# Patient Record
Sex: Male | Born: 2000 | Race: Black or African American | Hispanic: No | Marital: Single | Smoking: Current every day smoker
Health system: Southern US, Community
[De-identification: ages and names within clinical notes are randomized; demographics above are authoritative.]

## PROBLEM LIST (undated history)

## (undated) DIAGNOSIS — J45909 Unspecified asthma, uncomplicated: Secondary | ICD-10-CM

---
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
## 2018-11-18 ENCOUNTER — Emergency Department
Admission: EM | Admit: 2018-11-18 | Discharge: 2018-11-18 | Disposition: A | Discharge status: Home / Self Care | Payer: HRSA Program | Attending: Student | Admitting: Student

## 2018-11-18 ENCOUNTER — Emergency Department: Payer: HRSA Program

## 2018-11-18 ENCOUNTER — Other Ambulatory Visit: Payer: Self-pay

## 2018-11-18 DIAGNOSIS — U071 COVID-19: Secondary | ICD-10-CM | POA: Diagnosis not present

## 2018-11-18 DIAGNOSIS — J45909 Unspecified asthma, uncomplicated: Secondary | ICD-10-CM | POA: Diagnosis not present

## 2018-11-18 DIAGNOSIS — J069 Acute upper respiratory infection, unspecified: Secondary | ICD-10-CM | POA: Insufficient documentation

## 2018-11-18 DIAGNOSIS — Z20822 Contact with and (suspected) exposure to covid-19: Secondary | ICD-10-CM

## 2018-11-18 DIAGNOSIS — Z20828 Contact with and (suspected) exposure to other viral communicable diseases: Secondary | ICD-10-CM

## 2018-11-18 DIAGNOSIS — R05 Cough: Secondary | ICD-10-CM | POA: Diagnosis present

## 2018-11-18 DIAGNOSIS — F17210 Nicotine dependence, cigarettes, uncomplicated: Secondary | ICD-10-CM | POA: Insufficient documentation

## 2018-11-18 MED ORDER — PSEUDOEPH-BROMPHEN-DM 30-2-10 MG/5ML PO SYRP: 5.0000 mL | ORAL_SOLUTION | Freq: Four times a day (QID) | ORAL | 0 refills | Status: DC | PRN

## 2018-11-18 NOTE — ED Triage Notes (Signed)
Pt here for cough, back pain, low grade fevers, SHOB, sore throat, and decreased appetite.  Mom was positive for COVID.  Pt sx started yesterday. Has been taking tylenol. Does have hx asthma.  Unlabored in triage.  VS all WNL.  Pt smokes weed and cigarettes despite asthma. Moving good air on auscultation.

## 2018-11-18 NOTE — ED Provider Notes (Signed)
[facility] Emergency Department Provider Note   ____________________________________________   First MD Initiated Contact with Patient 11/18/18 1755     (approximate)  I have reviewed the triage vital signs and the nursing notes.   HISTORY  Chief Complaint Cough    HPI Andrew Romero is a 18 y.o. male patient presents with productive cough and body aches.  Patient also stated pain with inspiration.  Patient states sore throat, decreased appetite and low-grade fever.  Patient has a history of asthma and does admit to smoking marijuana and tobacco use.  Patient state mother was diagnosed with COVID-19 1 week ago.         Past Medical History:  Diagnosis Date  . Asthma     There are no active problems to display for this patient.   History reviewed. No pertinent surgical history.  Prior to Admission medications   Medication Sig Start Date End Date Taking? Authorizing Provider  brompheniramine-pseudoephedrine-DM 30-2-10 MG/5ML syrup Take 5 mLs by mouth 4 (four) times daily as needed. 11/18/18   Sable Feil, PA-C    Allergies Patient has no known allergies.  History reviewed. No pertinent family history.  Social History Social History   Tobacco Use  . Smoking status: Current Every Day Smoker  . Smokeless tobacco: Never Used  Substance Use Topics  . Alcohol use: Yes    Comment: occasional  . Drug use: Yes    Types: Marijuana    Review of Systems Constitutional: No fever/chills Eyes: No visual changes. ENT: Sore throat.. Cardiovascular: Denies chest pain. Respiratory: Denies shortness of breath.  Productive cough. Gastrointestinal: No abdominal pain.  No nausea, no vomiting.  No diarrhea.  No constipation.  Decreased appetite. Genitourinary: Negative for dysuria. Musculoskeletal: Negative for back pain. Skin: Negative for rash. Neurological: Negative for headaches, focal weakness or numbness.    ____________________________________________   PHYSICAL EXAM:  VITAL SIGNS: ED Triage Vitals  Enc Vitals Group     BP 11/18/18 1725 117/81     Pulse Rate 11/18/18 1725 98     Resp 11/18/18 1725 20     Temp 11/18/18 1725 98.4 F (36.9 C)     Temp Source 11/18/18 1725 Oral     SpO2 11/18/18 1725 100 %     Weight 11/18/18 1726 160 lb (72.6 kg)     Height 11/18/18 1726 6\' 2"  (1.88 m)     Head Circumference --      Peak Flow --      Pain Score 11/18/18 1726 7     Pain Loc --      Pain Edu? --      Excl. in [place]? --     Constitutional: Alert and oriented. Well appearing and in no acute distress. Nose: Clear rhinorrhea. Mouth/Throat: Mucous membranes are moist.  Oropharynx non-erythematous. Neck: No stridor.   Hematological/Lymphatic/Immunilogical: No cervical lymphadenopathy. Cardiovascular: Normal rate, regular rhythm. Grossly normal heart sounds.  Good peripheral circulation. Respiratory: Normal respiratory effort.  No retractions. Lungs CTAB. Gastrointestinal: Soft and nontender. No distention. No abdominal bruits. No CVA tenderness. Skin:  Skin is warm, dry and intact. No rash noted. Psychiatric: Mood and affect are normal. Speech and behavior are normal.  ____________________________________________   LABS (all labs ordered are listed, but only abnormal results are displayed)  Labs Reviewed  SARS CORONAVIRUS 2 (TAT 6-12 HRS)   ____________________________________________  EKG   ____________________________________________  RADIOLOGY  ED MD interpretation:    Official radiology report(s): Dg Chest 1  View  Result Date: 11/18/2018 CLINICAL DATA:  Chest congestion, cough.  COVID exposure EXAM: CHEST  1 VIEW COMPARISON:  None. FINDINGS: Heart and mediastinal contours are within normal limits. No focal opacities or effusions. No acute bony abnormality. IMPRESSION: No active disease. Electronically Signed   By: Charlett NoseKevin  Dover M.D.   On: 11/18/2018 19:14     ____________________________________________   PROCEDURES  Procedure(s) performed (including Critical Care):  Procedures   ____________________________________________   INITIAL IMPRESSION / ASSESSMENT AND PLAN / ED COURSE  As part of my medical decision making, I reviewed the following data within the electronic MEDICAL RECORD NUMBER         Andrew Romero was evaluated in Emergency Department on 11/18/2018 for the symptoms described in the history of present illness. He was evaluated in the context of the global COVID-19 pandemic, which necessitated consideration that the patient might be at risk for infection with the SARS-CoV-2 virus that causes COVID-19. Institutional protocols and algorithms that pertain to the evaluation of patients at risk for COVID-19 are in a state of rapid change based on information released by regulatory bodies including the CDC and federal and state organizations. These policies and algorithms were followed during the patient's care in the ED.  Patient presents with signs and symptoms consistent respiratory infection.  Patient has positive exposure COVID-19 by close family member.  Discussed negative chest x-ray findings with patient.  Patient given discharge care instruction advised self quarantine pending results of COVID-19 testing.     ____________________________________________   FINAL CLINICAL IMPRESSION(S) / ED DIAGNOSES  Final diagnoses:  Close Exposure to Covid-19 Virus  Viral URI with cough     ED Discharge Orders         Ordered    brompheniramine-pseudoephedrine-DM 30-2-10 MG/5ML syrup  4 times daily PRN     11/18/18 1922           Note:  This document was prepared using Dragon voice recognition software and may include unintentional dictation errors.    Joni ReiningSmith, Ronald K, PA-C 11/18/18 Margretta [id number]    Miguel AschoffMonks, Sarah L., MD 11/19/18 [phone]

## 2018-11-18 NOTE — ED Notes (Signed)
See triage note   Here for cough and back pain  States he has had some low grade fever at home but afebrile on arrival   Has had some SOB

## 2018-11-18 NOTE — Discharge Instructions (Signed)
Advised self quarantine pending results of COVID-19 testing.

## 2018-11-18 NOTE — ED Triage Notes (Signed)
First Nurse Note:  Arrives with c/o pain with inhalation.  Patient's mom diagnosed with C-19 one week ago.  Patient is AAOx3. Skin warm and dry.  No SOB/ DOE.  NAD

## 2018-11-19 LAB — SARS CORONAVIRUS 2 (TAT 6-24 HRS): SARS Coronavirus 2: POSITIVE — AB

## 2019-01-28 ENCOUNTER — Ambulatory Visit: Payer: Self-pay

## 2019-05-19 ENCOUNTER — Encounter (HOSPITAL_COMMUNITY): Payer: Self-pay

## 2019-05-19 ENCOUNTER — Other Ambulatory Visit: Payer: Self-pay

## 2019-05-19 ENCOUNTER — Ambulatory Visit (HOSPITAL_COMMUNITY)
Admission: EM | Admit: 2019-05-19 | Discharge: 2019-05-19 | Disposition: A | Discharge status: Home / Self Care | Payer: Medicaid Other | Attending: Family Medicine | Admitting: Family Medicine

## 2019-05-19 DIAGNOSIS — Z113 Encounter for screening for infections with a predominantly sexual mode of transmission: Secondary | ICD-10-CM | POA: Diagnosis not present

## 2019-05-19 LAB — HIV ANTIBODY (ROUTINE TESTING W REFLEX): HIV Screen 4th Generation wRfx: NONREACTIVE

## 2019-05-19 NOTE — Discharge Instructions (Addendum)
We have sent testing for sexually transmitted infections. We will notify you of any positive results once they are received. If required, we will prescribe any medications you might need.  Please refrain from all sexual activity for at least the next seven days.  

## 2019-05-19 NOTE — ED Triage Notes (Signed)
Pt presents to UC for STD's testing. Pt denies any sings and symptoms.

## 2019-05-20 LAB — RPR: RPR Ser Ql: NONREACTIVE

## 2019-05-20 NOTE — ED Provider Notes (Signed)
[facility]   [id number] 05/19/19 Arrival Time: 1856  ASSESSMENT & PLAN:  1. Screening for STDs (sexually transmitted diseases)     No empiric treatment desired.   Discharge Instructions     We have sent testing for sexually transmitted infections. We will notify you of any positive results once they are received. If required, we will prescribe any medications you might need.  Please refrain from all sexual activity for at least the next seven days.     Pending: Labs Reviewed  HIV ANTIBODY (ROUTINE TESTING W REFLEX)  RPR  CYTOLOGY, (ORAL, ANAL, URETHRAL) ANCILLARY ONLY    Will notify of any positive results. Instructed to refrain from sexual activity for at least seven days.  Reviewed expectations re: course of current medical issues. Questions answered. Outlined signs and symptoms indicating need for more acute intervention. Patient verbalized understanding. After Visit Summary given.   SUBJECTIVE:  Andrew Romero is a 19 y.o. male who requests STD screening. No symptoms. "Just want to get checked". Is sexually active with male partners. Otherwise well. History of STI: H/O treated gonorrhea and chlamydia.   OBJECTIVE:  Vitals:   05/19/19 1931  BP: 105/67  Pulse: 90  Resp: 16  Temp: 98.4 F (36.9 C)  TempSrc: Oral  SpO2: 97%     General appearance: alert, cooperative, appears stated age and no distress Throat: lips, mucosa, and tongue normal; teeth and gums normal Lungs: unlabored respirations Back: FROM at waist Abdomen: soft Skin: warm and dry Psychological: alert and cooperative; normal mood and affect.  Results for orders placed or performed during the hospital encounter of 05/19/19  HIV antibody  Result Value Ref Range   HIV Screen 4th Generation wRfx NON REACTIVE NON REACTIVE    Labs Reviewed  HIV ANTIBODY (ROUTINE TESTING W REFLEX)  RPR  CYTOLOGY, (ORAL, ANAL, URETHRAL) ANCILLARY ONLY    Allergies  Allergen Reactions  .  Bee Pollen     Past Medical History:  Diagnosis Date  . Asthma    History reviewed. No pertinent family history. Social History   Socioeconomic History  . Marital status: Single    Spouse name: Not on file  . Number of children: Not on file  . Years of education: Not on file  . Highest education level: Not on file  Occupational History  . Not on file  Tobacco Use  . Smoking status: Current Every Day Smoker  . Smokeless tobacco: Never Used  Substance and Sexual Activity  . Alcohol use: Yes    Comment: occasional  . Drug use: Yes    Types: Marijuana  . Sexual activity: Never  Other Topics Concern  . Not on file  Social History Narrative  . Not on file   Social Determinants of Health   Financial Resource Strain:   . Difficulty of Paying Living Expenses: Not on file  Food Insecurity:   . Worried About Programme researcher, broadcasting/film/video in the Last Year: Not on file  . Ran Out of Food in the Last Year: Not on file  Transportation Needs:   . Lack of Transportation (Medical): Not on file  . Lack of Transportation (Non-Medical): Not on file  Physical Activity:   . Days of Exercise per Week: Not on file  . Minutes of Exercise per Session: Not on file  Stress:   . Feeling of Stress : Not on file  Social Connections:   . Frequency of Communication with Friends and Family: Not on file  . Frequency  of Social Gatherings with Friends and Family: Not on file  . Attends Religious Services: Not on file  . Active Member of Clubs or Organizations: Not on file  . Attends Archivist Meetings: Not on file  . Marital Status: Not on file  Intimate Partner Violence:   . Fear of Current or Ex-Partner: Not on file  . Emotionally Abused: Not on file  . Physically Abused: Not on file  . Sexually Abused: Not on file          Vanessa Kick, MD 05/20/19 [phone]

## 2019-05-21 LAB — CYTOLOGY, (ORAL, ANAL, URETHRAL) ANCILLARY ONLY
Chlamydia: NEGATIVE
Neisseria Gonorrhea: NEGATIVE
Trichomonas: NEGATIVE

## 2019-05-24 ENCOUNTER — Telehealth (HOSPITAL_COMMUNITY): Payer: Self-pay | Admitting: Emergency Medicine

## 2019-05-24 NOTE — Telephone Encounter (Signed)
Verified patient identity with 2 verifiers.  Patient had called requesting lab results.  All reported as negative and informed the patient .  Patient did not have any questions

## 2019-11-24 ENCOUNTER — Other Ambulatory Visit: Payer: Self-pay

## 2019-11-24 ENCOUNTER — Emergency Department (HOSPITAL_COMMUNITY)
Admission: EM | Admit: 2019-11-24 | Discharge: 2019-11-24 | Disposition: A | Discharge status: Home / Self Care | Payer: Medicaid Other | Attending: Emergency Medicine | Admitting: Emergency Medicine

## 2019-11-24 ENCOUNTER — Encounter (HOSPITAL_COMMUNITY): Payer: Self-pay | Admitting: Emergency Medicine

## 2019-11-24 ENCOUNTER — Emergency Department (HOSPITAL_COMMUNITY): Payer: Medicaid Other

## 2019-11-24 DIAGNOSIS — M7918 Myalgia, other site: Secondary | ICD-10-CM | POA: Diagnosis present

## 2019-11-24 DIAGNOSIS — J069 Acute upper respiratory infection, unspecified: Secondary | ICD-10-CM | POA: Diagnosis not present

## 2019-11-24 DIAGNOSIS — F17200 Nicotine dependence, unspecified, uncomplicated: Secondary | ICD-10-CM | POA: Diagnosis not present

## 2019-11-24 DIAGNOSIS — J45909 Unspecified asthma, uncomplicated: Secondary | ICD-10-CM | POA: Diagnosis not present

## 2019-11-24 DIAGNOSIS — Z20822 Contact with and (suspected) exposure to covid-19: Secondary | ICD-10-CM | POA: Diagnosis not present

## 2019-11-24 DIAGNOSIS — R079 Chest pain, unspecified: Secondary | ICD-10-CM

## 2019-11-24 LAB — CBC WITH DIFFERENTIAL/PLATELET
Abs Immature Granulocytes: 0.03 10*3/uL (ref 0.00–0.07)
Basophils Absolute: 0.1 10*3/uL (ref 0.0–0.1)
Basophils Relative: 1 %
Eosinophils Absolute: 0.4 10*3/uL (ref 0.0–0.5)
Eosinophils Relative: 3 %
HCT: 40.3 % (ref 39.0–52.0)
Hemoglobin: 13.9 g/dL (ref 13.0–17.0)
Immature Granulocytes: 0 %
Lymphocytes Relative: 16 %
Lymphs Abs: 1.7 10*3/uL (ref 0.7–4.0)
MCH: 31.2 pg (ref 26.0–34.0)
MCHC: 34.5 g/dL (ref 30.0–36.0)
MCV: 90.4 fL (ref 80.0–100.0)
Monocytes Absolute: 1.2 10*3/uL — ABNORMAL HIGH (ref 0.1–1.0)
Monocytes Relative: 11 %
Neutro Abs: 7.4 10*3/uL (ref 1.7–7.7)
Neutrophils Relative %: 69 %
Platelets: 294 10*3/uL (ref 150–400)
RBC: 4.46 MIL/uL (ref 4.22–5.81)
RDW: 12.5 % (ref 11.5–15.5)
WBC: 10.7 10*3/uL — ABNORMAL HIGH (ref 4.0–10.5)
nRBC: 0 % (ref 0.0–0.2)

## 2019-11-24 LAB — LACTIC ACID, PLASMA: Lactic Acid, Venous: 1.2 mmol/L (ref 0.5–1.9)

## 2019-11-24 LAB — COMPREHENSIVE METABOLIC PANEL
ALT: 15 U/L (ref 0–44)
AST: 20 U/L (ref 15–41)
Albumin: 4.4 g/dL (ref 3.5–5.0)
Alkaline Phosphatase: 49 U/L (ref 38–126)
Anion gap: 11 (ref 5–15)
BUN: 9 mg/dL (ref 6–20)
CO2: 25 mmol/L (ref 22–32)
Calcium: 9.2 mg/dL (ref 8.9–10.3)
Chloride: 104 mmol/L (ref 98–111)
Creatinine, Ser: 0.86 mg/dL (ref 0.61–1.24)
GFR calc Af Amer: >60 mL/min (ref >60)
GFR calc non Af Amer: >60 mL/min (ref >60)
Glucose, Bld: 121 mg/dL — ABNORMAL HIGH (ref 70–99)
Potassium: 3.7 mmol/L (ref 3.5–5.1)
Sodium: 140 mmol/L (ref 135–145)
Total Bilirubin: 1.6 mg/dL — ABNORMAL HIGH (ref 0.3–1.2)
Total Protein: 7.7 g/dL (ref 6.5–8.1)

## 2019-11-24 LAB — TROPONIN I (HIGH SENSITIVITY): Troponin I (High Sensitivity): <2 ng/L (ref <18)

## 2019-11-24 LAB — SARS CORONAVIRUS 2 BY RT PCR (HOSPITAL ORDER, PERFORMED IN ~~LOC~~ HOSPITAL LAB): SARS Coronavirus 2: NEGATIVE

## 2019-11-24 NOTE — ED Provider Notes (Addendum)
[facility]-EMERGENCY DEPT Provider Note   CSN: [id number] Arrival date & time: 11/24/19  2131     History Chief Complaint  Patient presents with  . Chest Pain    Andrew Romero is a 19 y.o. male.  HPI 19 year old male with a history of asthma presents to the ER for body aches, chest pain, shortness of breath, nasal congestion and loss of taste of smell for several days.  Patient states he had Covid approximately a year ago and feels similarly.  He is not vaccinated.  He feels more winded and short of breath with chest pain when he walks.  Denies any recent travel.  No pain on inspiration.  No measurable fevers but does state that he alternates between feeling hot and cold.  Endorses a mucousy cough without any blood.  Denies any nausea, vomiting, abdominal pain.  No diarrhea.      Past Medical History:  Diagnosis Date  . Asthma     There are no problems to display for this patient.   History reviewed. No pertinent surgical history.     History reviewed. No pertinent family history.  Social History   Tobacco Use  . Smoking status: Current Every Day Smoker  . Smokeless tobacco: Never Used  Substance Use Topics  . Alcohol use: Yes    Comment: occasional  . Drug use: Yes    Types: Marijuana    Home Medications Prior to Admission medications   Medication Sig Start Date End Date Taking? Authorizing Provider  brompheniramine-pseudoephedrine-DM 30-2-10 MG/5ML syrup Take 5 mLs by mouth 4 (four) times daily as needed. 11/18/18   Joni Reining, PA-C    Allergies    Bee pollen  Review of Systems   Review of Systems  Constitutional: Positive for activity change, appetite change, chills and fatigue. Negative for fever.  HENT: Negative for ear pain and sore throat.   Eyes: Negative for pain and visual disturbance.  Respiratory: Positive for cough and shortness of breath.   Cardiovascular: Positive for chest pain. Negative for palpitations.    Gastrointestinal: Negative for abdominal pain, diarrhea, nausea and vomiting.  Genitourinary: Negative for dysuria and hematuria.  Musculoskeletal: Negative for arthralgias and back pain.  Skin: Negative for color change and rash.  Neurological: Positive for weakness. Negative for seizures and syncope.  All other systems reviewed and are negative.   Physical Exam Updated Vital Signs BP 126/87 (BP Location: Left Arm)   Pulse (!) 108   Temp 98.4 F (36.9 C) (Oral)   Resp 14   Ht 6\' 3"  (1.905 m)   Wt 72.6 kg   SpO2 100%   BMI 20.01 kg/m   Physical Exam Vitals and nursing note reviewed.  Constitutional:      General: He is not in acute distress.    Appearance: He is well-developed. He is not ill-appearing or diaphoretic.  HENT:     Head: Normocephalic and atraumatic.  Eyes:     Conjunctiva/sclera: Conjunctivae normal.  Cardiovascular:     Rate and Rhythm: Normal rate and regular rhythm.     Pulses:          Radial pulses are 2+ on the right side and 2+ on the left side.     Heart sounds: No murmur heard.   Pulmonary:     Effort: Pulmonary effort is normal. No respiratory distress.     Breath sounds: Normal breath sounds. No decreased breath sounds.  Chest:     Chest wall: No  tenderness or crepitus.  Abdominal:     Palpations: Abdomen is soft.     Tenderness: There is no abdominal tenderness.  Musculoskeletal:        General: Normal range of motion.     Cervical back: Neck supple.     Right lower leg: No tenderness. No edema.     Left lower leg: No tenderness. No edema.  Skin:    General: Skin is warm and dry.  Neurological:     Mental Status: He is alert.     ED Results / Procedures / Treatments   Labs (all labs ordered are listed, but only abnormal results are displayed) Labs Reviewed  COMPREHENSIVE METABOLIC PANEL - Abnormal; Notable for the following components:      Result Value   Glucose, Bld 121 (*)    Total Bilirubin 1.6 (*)    All other components  within normal limits  CBC WITH DIFFERENTIAL/PLATELET - Abnormal; Notable for the following components:   WBC 10.7 (*)    Monocytes Absolute 1.2 (*)    All other components within normal limits  SARS CORONAVIRUS 2 BY RT PCR (HOSPITAL ORDER, PERFORMED IN [facility])  LACTIC ACID, PLASMA  LACTIC ACID, PLASMA  URINALYSIS, ROUTINE W REFLEX MICROSCOPIC  TROPONIN I (HIGH SENSITIVITY)    EKG None  Radiology DG Chest Port 1 View  Result Date: 11/24/2019 CLINICAL DATA:  Chest pain EXAM: PORTABLE CHEST 1 VIEW COMPARISON:  11/18/2018 FINDINGS: The heart size and mediastinal contours are within normal limits. Both lungs are clear. The visualized skeletal structures are unremarkable. IMPRESSION: No active disease. Electronically Signed   By: Jasmine Pang M.D.   On: 11/24/2019 22:03    Procedures Procedures (including critical care time)  Medications Ordered in ED Medications - No data to display  ED Course  I have reviewed the triage vital signs and the nursing notes.  Pertinent labs & imaging results that were available during my care of the patient were reviewed by me and considered in my medical decision making (see chart for details).    MDM Rules/Calculators/A&P                         19 year old male w/ body aches, SOB, CP and nasal congestion On presentation, the patient is alert, oriented, calm, speaking full sentences without increased work of breathing.  Nontoxic appearing.  Vitals overall reassuring, he was slightly tachycardic on arrival, however this improved when I was in his room.  Physical exam with clear lung sounds, soft nontender abdomen.  LABS: I personally reviewed and interpreted his lab work CMP without any electrolyte abnormalities, normal renal and liver function test. CBC with mild leukocytosis of 10.7, normal platelets and hemoglobin. Troponin < 2. Lactic acid 1.2, normal COVID test pending   DDx includes COVID, viral URI, pneumonia,  myocarditis/pericarditis, PE, pneumothorax   EKG: Sinus tach, no ST elevations or T wave inversions   MDM: CXR w/o evidence of pneumonia, pneumothorax. Doubt myocarditis, normal troponin and no ST elevations. Doubt PE as he is not hypoxic, no pleuritic chest pain. Slightly tachycardic however this improved throughout ED course.   DISPOSITION: Pt stable for discharge w. COVID test pending. Instructed on quarantine period and supportive measures. Strict return precautions discussed. He voices understanding and is agreeable.    Discussed the case w/ Dr. Jeraldine Loots who is agreeable to the above plan and disposition.   Final Clinical Impression(s) / ED Diagnoses Final  diagnoses:  Encounter for laboratory testing for COVID-19 virus  Viral URI    Rx / DC Orders ED Discharge Orders    None           Leone Brand 11/24/19 2316    Gerhard Munch, MD 11/26/19 1008

## 2019-11-24 NOTE — ED Triage Notes (Signed)
Patient state that he had covid a year ago and he feels the same way now. Patient states that he is having chest pain, body aches, runny nose, and lost of taste and smell.

## 2019-11-24 NOTE — Discharge Instructions (Addendum)
You were tested for COVID-19 today, make sure to quarantine until you receive the results. If this is positive, please make sure to quarantine additional 7 to 10 days. You may take ibuprofen/Tylenol for body aches and fevers, drink plenty of fluids, over-the-counter cold and flu medications. You may also buy an over-the-counter pulse oximeter which can be found at your local pharmacy. If your oxygen saturation drops below 90%, please make sure to return to the ER. Return to the ER for any worsening shortness of breath.      Person Under Monitoring Name: Andrew Romero  Location: [address] [place] [address]   Infection Prevention Recommendations for Individuals Confirmed to have, or Being Evaluated for, 2019 Novel Coronavirus (COVID-19) Infection Who Receive Care at Home  Individuals who are confirmed to have, or are being evaluated for, COVID-19 should follow the prevention steps below until a healthcare provider or local or state health department says they can return to normal activities.  Stay home except to get medical care You should restrict activities outside your home, except for getting medical care. Do not go to work, school, or public areas, and do not use public transportation or taxis.  Call ahead before visiting your doctor Before your medical appointment, call the healthcare provider and tell them that you have, or are being evaluated for, COVID-19 infection. This will help the healthcare provider's office take steps to keep other people from getting infected. Ask your healthcare provider to call the local or state health department.  Monitor your symptoms Seek prompt medical attention if your illness is worsening (e.g., difficulty breathing). Before going to your medical appointment, call the healthcare provider and tell them that you have, or are being evaluated for, COVID-19 infection. Ask your healthcare provider to call the local or state health  department.  Wear a facemask You should wear a facemask that covers your nose and mouth when you are in the same room with other people and when you visit a healthcare provider. People who live with or visit you should also wear a facemask while they are in the same room with you.  Separate yourself from other people in your home As much as possible, you should stay in a different room from other people in your home. Also, you should use a separate bathroom, if available.  Avoid sharing household items You should not share dishes, drinking glasses, cups, eating utensils, towels, bedding, or other items with other people in your home. After using these items, you should wash them thoroughly with soap and water.  Cover your coughs and sneezes Cover your mouth and nose with a tissue when you cough or sneeze, or you can cough or sneeze into your sleeve. Throw used tissues in a lined trash can, and immediately wash your hands with soap and water for at least 20 seconds or use an alcohol-based hand rub.  Wash your [employer] your hands often and thoroughly with soap and water for at least 20 seconds. You can use an alcohol-based hand sanitizer if soap and water are not available and if your hands are not visibly dirty. Avoid touching your eyes, nose, and mouth with unwashed hands.   Prevention Steps for Caregivers and Household Members of Individuals Confirmed to have, or Being Evaluated for, COVID-19 Infection Being Cared for in the Home  If you live with, or provide care at home for, a person confirmed to have, or being evaluated for, COVID-19 infection please follow these guidelines to prevent  infection:  Follow healthcare provider's instructions Make sure that you understand and can help the patient follow any healthcare provider instructions for all care.  Provide for the patient's basic needs You should help the patient with basic needs in the home and provide support for getting  groceries, prescriptions, and other personal needs.  Monitor the patient's symptoms If they are getting sicker, call his or her medical provider and tell them that the patient has, or is being evaluated for, COVID-19 infection. This will help the healthcare provider's office take steps to keep other people from getting infected. Ask the healthcare provider to call the local or state health department.  Limit the number of people who have contact with the patient If possible, have only one caregiver for the patient. Other household members should stay in another home or place of residence. If this is not possible, they should stay in another room, or be separated from the patient as much as possible. Use a separate bathroom, if available. Restrict visitors who do not have an essential need to be in the home.  Keep older adults, very young children, and other sick people away from the patient Keep older adults, very young children, and those who have compromised immune systems or chronic health conditions away from the patient. This includes people with chronic heart, lung, or kidney conditions, diabetes, and cancer.  Ensure good ventilation Make sure that shared spaces in the home have good air flow, such as from an air conditioner or an opened window, weather permitting.  Wash your hands often Wash your hands often and thoroughly with soap and water for at least 20 seconds. You can use an alcohol based hand sanitizer if soap and water are not available and if your hands are not visibly dirty. Avoid touching your eyes, nose, and mouth with unwashed hands. Use disposable paper towels to dry your hands. If not available, use dedicated cloth towels and replace them when they become wet.  Wear a facemask and gloves Wear a disposable facemask at all times in the room and gloves when you touch or have contact with the patient's blood, body fluids, and/or secretions or excretions, such as sweat,  saliva, sputum, nasal mucus, vomit, urine, or feces.  Ensure the mask fits over your nose and mouth tightly, and do not touch it during use. Throw out disposable facemasks and gloves after using them. Do not reuse. Wash your hands immediately after removing your facemask and gloves. If your personal clothing becomes contaminated, carefully remove clothing and launder. Wash your hands after handling contaminated clothing. Place all used disposable facemasks, gloves, and other waste in a lined container before disposing them with other household waste. Remove gloves and wash your hands immediately after handling these items.  Do not share dishes, glasses, or other household items with the patient Avoid sharing household items. You should not share dishes, drinking glasses, cups, eating utensils, towels, bedding, or other items with a patient who is confirmed to have, or being evaluated for, COVID-19 infection. After the person uses these items, you should wash them thoroughly with soap and water.  Wash laundry thoroughly Immediately remove and wash clothes or bedding that have blood, body fluids, and/or secretions or excretions, such as sweat, saliva, sputum, nasal mucus, vomit, urine, or feces, on them. Wear gloves when handling laundry from the patient. Read and follow directions on labels of laundry or clothing items and detergent. In general, wash and dry with the warmest temperatures recommended on the  label.  Clean all areas the individual has used often Clean all touchable surfaces, such as counters, tabletops, doorknobs, bathroom fixtures, toilets, phones, keyboards, tablets, and bedside tables, every day. Also, clean any surfaces that may have blood, body fluids, and/or secretions or excretions on them. Wear gloves when cleaning surfaces the patient has come in contact with. Use a diluted bleach solution (e.g., dilute bleach with 1 part bleach and 10 parts water) or a household disinfectant  with a label that says EPA-registered for coronaviruses. To make a bleach solution at home, add 1 tablespoon of bleach to 1 quart (4 cups) of water. For a larger supply, add  cup of bleach to 1 gallon (16 cups) of water. Read labels of cleaning products and follow recommendations provided on product labels. Labels contain instructions for safe and effective use of the cleaning product including precautions you should take when applying the product, such as wearing gloves or eye protection and making sure you have good ventilation during use of the product. Remove gloves and wash hands immediately after cleaning.  Monitor yourself for signs and symptoms of illness Caregivers and household members are considered close contacts, should monitor their health, and will be asked to limit movement outside of the home to the extent possible. Follow the monitoring steps for close contacts listed on the symptom monitoring form.   ? If you have additional questions, contact your local health department or call the epidemiologist on call at [phone] (available 24/7). ? This guidance is subject to change. For the most up-to-date guidance from [facility], please refer to their website: [email or web address]

## 2020-04-25 ENCOUNTER — Other Ambulatory Visit: Payer: Self-pay

## 2020-04-25 ENCOUNTER — Ambulatory Visit: Payer: Self-pay

## 2020-04-25 ENCOUNTER — Ambulatory Visit
Admission: EM | Admit: 2020-04-25 | Discharge: 2020-04-25 | Disposition: A | Discharge status: Home / Self Care | Payer: Medicaid Other | Attending: Emergency Medicine | Admitting: Emergency Medicine

## 2020-04-25 ENCOUNTER — Encounter: Payer: Self-pay | Admitting: Emergency Medicine

## 2020-04-25 DIAGNOSIS — J069 Acute upper respiratory infection, unspecified: Secondary | ICD-10-CM | POA: Diagnosis not present

## 2020-04-25 DIAGNOSIS — Z20822 Contact with and (suspected) exposure to covid-19: Secondary | ICD-10-CM

## 2020-04-25 MED ORDER — DM-GUAIFENESIN ER 30-600 MG PO TB12: 1.0000 | ORAL_TABLET | Freq: Two times a day (BID) | ORAL | 0 refills | Status: AC

## 2020-04-25 MED ORDER — CETIRIZINE HCL 10 MG PO CAPS: 10.0000 mg | ORAL_CAPSULE | Freq: Every day | ORAL | 0 refills | Status: AC

## 2020-04-25 MED ORDER — BENZONATATE 200 MG PO CAPS: 200.0000 mg | ORAL_CAPSULE | Freq: Three times a day (TID) | ORAL | 0 refills | Status: AC | PRN

## 2020-04-25 MED ORDER — ALBUTEROL SULFATE HFA 108 (90 BASE) MCG/ACT IN AERS: 1.0000 | INHALATION_SPRAY | Freq: Four times a day (QID) | RESPIRATORY_TRACT | 0 refills | Status: AC | PRN

## 2020-04-25 NOTE — ED Provider Notes (Signed)
[facility]    CSN: [id number] Arrival date & time: 04/25/20  1544      History   Chief Complaint Chief Complaint  Patient presents with  . Cough    HPI Andrew Romero is a 20 y.o. male history of asthma presenting today for evaluation of cough and congestion.  Reports cough and congestion for approximately 4 days.  Reports associated chest discomfort.  No fevers or chills.  Very mild rhinorrhea, mild sneezing.  Denies sore throat.  Denies any close sick contacts.  Reports "heart pain" chest discomfort, abdominal pain with coughing.  HPI  Past Medical History:  Diagnosis Date  . Asthma     There are no problems to display for this patient.   History reviewed. No pertinent surgical history.     Home Medications    Prior to Admission medications   Medication Sig Start Date End Date Taking? Authorizing Provider  albuterol (VENTOLIN HFA) 108 (90 Base) MCG/ACT inhaler Inhale 1-2 puffs into the lungs every 6 (six) hours as needed for wheezing or shortness of breath. 04/25/20  Yes Sidhant Helderman C, PA-C  benzonatate (TESSALON) 200 MG capsule Take 1 capsule (200 mg total) by mouth 3 (three) times daily as needed for up to 7 days for cough. 04/25/20 05/02/20 Yes Jassen Sarver C, PA-C  Cetirizine HCl 10 MG CAPS Take 1 capsule (10 mg total) by mouth daily for 10 days. 04/25/20 05/05/20 Yes Manda Holstad C, PA-C  dextromethorphan-guaiFENesin (MUCINEX DM) 30-600 MG 12hr tablet Take 1 tablet by mouth 2 (two) times daily. 04/25/20  Yes Eleanor Gatliff, Junius Creamer, PA-C    Family History History reviewed. No pertinent family history.  Social History Social History   Tobacco Use  . Smoking status: Current Every Day Smoker  . Smokeless tobacco: Never Used  Substance Use Topics  . Alcohol use: Yes    Comment: occasional  . Drug use: Yes    Types: Marijuana     Allergies   Bee pollen   Review of Systems Review of Systems  Constitutional: Negative for activity change,  appetite change, chills, fatigue and fever.  HENT: Positive for congestion and rhinorrhea. Negative for ear pain, sinus pressure, sore throat and trouble swallowing.   Eyes: Negative for discharge and redness.  Respiratory: Positive for cough. Negative for chest tightness and shortness of breath.   Cardiovascular: Negative for chest pain.  Gastrointestinal: Negative for abdominal pain, diarrhea, nausea and vomiting.  Musculoskeletal: Negative for myalgias.  Skin: Negative for rash.  Neurological: Negative for dizziness, light-headedness and headaches.     Physical Exam Triage Vital Signs ED Triage Vitals  Enc Vitals Group     BP 04/25/20 1600 118/78     Pulse Rate 04/25/20 1600 87     Resp 04/25/20 1600 18     Temp 04/25/20 1600 99 F (37.2 C)     Temp Source 04/25/20 1600 Oral     SpO2 04/25/20 1600 98 %     Weight --      Height --      Head Circumference --      Peak Flow --      Pain Score 04/25/20 1601 4     Pain Loc --      Pain Edu? --      Excl. in GC? --    No data found.  Updated Vital Signs BP 118/78 (BP Location: Left Arm)   Pulse 87   Temp 99 F (37.2 C) (Oral)  Resp 18   SpO2 98%   Visual Acuity Right Eye Distance:   Left Eye Distance:   Bilateral Distance:    Right Eye Near:   Left Eye Near:    Bilateral Near:     Physical Exam Vitals and nursing note reviewed.  Constitutional:      Appearance: He is well-developed and well-nourished.     Comments: No acute distress  HENT:     Head: Normocephalic and atraumatic.     Ears:     Comments: Bilateral ears without tenderness to palpation of external auricle, tragus and mastoid, EAC's without erythema or swelling, TM's with good bony landmarks and cone of light. Non erythematous.     Nose: Nose normal.     Mouth/Throat:     Comments: Oral mucosa pink and moist, no tonsillar enlargement or exudate. Posterior pharynx patent and nonerythematous, no uvula deviation or swelling. Normal  phonation. Eyes:     Conjunctiva/sclera: Conjunctivae normal.  Cardiovascular:     Rate and Rhythm: Normal rate and regular rhythm.  Pulmonary:     Effort: Pulmonary effort is normal. No respiratory distress.     Comments: Breathing comfortably at rest, CTABL, no wheezing, rales or other adventitious sounds auscultated Abdominal:     General: There is no distension.  Musculoskeletal:        General: Normal range of motion.     Cervical back: Neck supple.  Skin:    General: Skin is warm and dry.  Neurological:     Mental Status: He is alert and oriented to person, place, and time.  Psychiatric:        Mood and Affect: Mood and affect normal.      UC Treatments / Results  Labs (all labs ordered are listed, but only abnormal results are displayed) Labs Reviewed  NOVEL CORONAVIRUS, NAA    EKG   Radiology No results found.  Procedures Procedures (including critical care time)  Medications Ordered in UC Medications - No data to display  Initial Impression / Assessment and Plan / UC Course  I have reviewed the triage vital signs and the nursing notes.  Pertinent labs & imaging results that were available during my care of the patient were reviewed by me and considered in my medical decision making (see chart for details).     Viral URI with cough-Covid test pending, suspect viral etiology, lungs clear, vital signs stable.  Recommending symptomatic and supportive care rest and fluids.  Albuterol inhaler as needed for asthma, deferring steroids at this time, advised patient to follow-up with chest discomfort or breathing not improving with recommendations today.  Discussed strict return precautions. Patient verbalized understanding and is agreeable with plan.  Final Clinical Impressions(s) / UC Diagnoses   Final diagnoses:  Encounter for screening laboratory testing for COVID-19 virus  Viral URI with cough     Discharge Instructions     Covid test pending,  monitor MyChart for results Continue use albuterol inhaler as needed for chest tightness shortness of breath and wheezing Tessalon every 8 hours for cough or over-the-counter Robitussin, Delsym or Dimetapp Daily cetirizine to help with nasal congestion, sneezing and drainage May use Mucinex DM twice daily for further cough and congestion relief Ibuprofen and Tylenol for chest wall pain related to coughing Rest and fluids Follow-up if not improving or worsening    ED Prescriptions    Medication Sig Dispense Auth. Provider   benzonatate (TESSALON) 200 MG capsule Take 1 capsule (200 mg total) by  mouth 3 (three) times daily as needed for up to 7 days for cough. 28 capsule Jalonda Antigua C, PA-C   dextromethorphan-guaiFENesin (MUCINEX DM) 30-600 MG 12hr tablet Take 1 tablet by mouth 2 (two) times daily. 20 tablet Anthony Roland C, PA-C   albuterol (VENTOLIN HFA) 108 (90 Base) MCG/ACT inhaler Inhale 1-2 puffs into the lungs every 6 (six) hours as needed for wheezing or shortness of breath. 8 g Kolten Ryback C, PA-C   Cetirizine HCl 10 MG CAPS Take 1 capsule (10 mg total) by mouth daily for 10 days. 10 capsule Aryiana Klinkner, Carlyss C, PA-C     PDMP not reviewed this encounter.   Lew Dawes, PA-C 04/25/20 1624

## 2020-04-25 NOTE — Discharge Instructions (Signed)
Covid test pending, monitor MyChart for results Continue use albuterol inhaler as needed for chest tightness shortness of breath and wheezing Tessalon every 8 hours for cough or over-the-counter Robitussin, Delsym or Dimetapp Daily cetirizine to help with nasal congestion, sneezing and drainage May use Mucinex DM twice daily for further cough and congestion relief Ibuprofen and Tylenol for chest wall pain related to coughing Rest and fluids Follow-up if not improving or worsening

## 2020-04-25 NOTE — ED Triage Notes (Signed)
Pt here for cough and congestion with pain with cough x 4 days; pt unsure about fever

## 2020-04-26 LAB — SARS-COV-2, NAA 2 DAY TAT

## 2020-04-26 LAB — NOVEL CORONAVIRUS, NAA: SARS-CoV-2, NAA: NOT DETECTED

## 2020-04-29 ENCOUNTER — Emergency Department (HOSPITAL_COMMUNITY): Payer: Medicaid Other

## 2020-04-29 ENCOUNTER — Encounter (HOSPITAL_COMMUNITY): Payer: Self-pay

## 2020-04-29 ENCOUNTER — Other Ambulatory Visit: Payer: Self-pay

## 2020-04-29 ENCOUNTER — Emergency Department (HOSPITAL_COMMUNITY)
Admission: EM | Admit: 2020-04-29 | Discharge: 2020-04-29 | Disposition: A | Discharge status: Home / Self Care | Payer: Medicaid Other | Attending: Emergency Medicine | Admitting: Emergency Medicine

## 2020-04-29 DIAGNOSIS — R55 Syncope and collapse: Secondary | ICD-10-CM | POA: Insufficient documentation

## 2020-04-29 DIAGNOSIS — R42 Dizziness and giddiness: Secondary | ICD-10-CM | POA: Insufficient documentation

## 2020-04-29 DIAGNOSIS — F17200 Nicotine dependence, unspecified, uncomplicated: Secondary | ICD-10-CM | POA: Diagnosis not present

## 2020-04-29 DIAGNOSIS — J45909 Unspecified asthma, uncomplicated: Secondary | ICD-10-CM | POA: Insufficient documentation

## 2020-04-29 DIAGNOSIS — R519 Headache, unspecified: Secondary | ICD-10-CM | POA: Insufficient documentation

## 2020-04-29 LAB — CBC WITH DIFFERENTIAL/PLATELET
Abs Immature Granulocytes: 0.01 10*3/uL (ref 0.00–0.07)
Basophils Absolute: 0 10*3/uL (ref 0.0–0.1)
Basophils Relative: 0 %
Eosinophils Absolute: 0.1 10*3/uL (ref 0.0–0.5)
Eosinophils Relative: 1 %
HCT: 39 % (ref 39.0–52.0)
Hemoglobin: 13.3 g/dL (ref 13.0–17.0)
Immature Granulocytes: 0 %
Lymphocytes Relative: 24 %
Lymphs Abs: 1.8 10*3/uL (ref 0.7–4.0)
MCH: 30.1 pg (ref 26.0–34.0)
MCHC: 34.1 g/dL (ref 30.0–36.0)
MCV: 88.2 fL (ref 80.0–100.0)
Monocytes Absolute: 0.5 10*3/uL (ref 0.1–1.0)
Monocytes Relative: 7 %
Neutro Abs: 5.1 10*3/uL (ref 1.7–7.7)
Neutrophils Relative %: 68 %
Platelets: 219 10*3/uL (ref 150–400)
RBC: 4.42 MIL/uL (ref 4.22–5.81)
RDW: 12 % (ref 11.5–15.5)
WBC: 7.5 10*3/uL (ref 4.0–10.5)
nRBC: 0 % (ref 0.0–0.2)

## 2020-04-29 LAB — BASIC METABOLIC PANEL
Anion gap: 8 (ref 5–15)
BUN: 10 mg/dL (ref 6–20)
CO2: 26 mmol/L (ref 22–32)
Calcium: 9 mg/dL (ref 8.9–10.3)
Chloride: 103 mmol/L (ref 98–111)
Creatinine, Ser: 0.69 mg/dL (ref 0.61–1.24)
GFR, Estimated: >60 mL/min (ref >60)
Glucose, Bld: 116 mg/dL — ABNORMAL HIGH (ref 70–99)
Potassium: 4 mmol/L (ref 3.5–5.1)
Sodium: 137 mmol/L (ref 135–145)

## 2020-04-29 LAB — TROPONIN I (HIGH SENSITIVITY): Troponin I (High Sensitivity): 4 ng/L (ref <18)

## 2020-04-29 NOTE — Discharge Instructions (Addendum)
Follow-up with a primary care doctor for further evaluation of your passing out.

## 2020-04-29 NOTE — ED Provider Notes (Signed)
[facility]-EMERGENCY DEPT Provider Note   CSN: [id number] Arrival date & time: 04/29/20  2008     History Chief Complaint  Patient presents with  . Syncope    Andrew Romero is a 20 y.o. male.  HPI   Patient presents after syncopal episode.  States he went into a store.  Then felt lightheaded.  He felt his if he was in a pass out and then did fall.  States he hit his head on his friend's leg and then possibly the ground.  Has a mild headache now.  States he was slightly confused after but pretty quickly came back to normal.  States that he is feeling better now.  He has been doing well recently.  Did smoke some marijuana shortly before going into the store.  States he has had episodes like this before but never been seen in the ER.  No family history of sudden cardiac death.  Patient is otherwise healthy. Past Medical History:  Diagnosis Date  . Asthma     There are no problems to display for this patient.   History reviewed. No pertinent surgical history.     No family history on file.  Social History   Tobacco Use  . Smoking status: Current Every Day Smoker  . Smokeless tobacco: Never Used  Substance Use Topics  . Alcohol use: Yes    Comment: occasional  . Drug use: Yes    Types: Marijuana    Home Medications Prior to Admission medications   Medication Sig Start Date End Date Taking? Authorizing Provider  albuterol (VENTOLIN HFA) 108 (90 Base) MCG/ACT inhaler Inhale 1-2 puffs into the lungs every 6 (six) hours as needed for wheezing or shortness of breath. 04/25/20  Yes Wieters, Hallie C, PA-C  benzonatate (TESSALON) 200 MG capsule Take 1 capsule (200 mg total) by mouth 3 (three) times daily as needed for up to 7 days for cough. 04/25/20 05/02/20 Yes Wieters, Hallie C, PA-C  Cetirizine HCl 10 MG CAPS Take 1 capsule (10 mg total) by mouth daily for 10 days. 04/25/20 05/05/20 Yes Wieters, Hallie C, PA-C  dextromethorphan-guaiFENesin (MUCINEX DM) 30-600  MG 12hr tablet Take 1 tablet by mouth 2 (two) times daily. 04/25/20  Yes Wieters, Hallie C, PA-C    Allergies    Bee pollen  Review of Systems   Review of Systems  Constitutional: Negative for appetite change.  HENT: Negative for congestion.   Respiratory: Negative for cough and shortness of breath.   Gastrointestinal: Negative for abdominal pain.  Genitourinary: Negative for enuresis.  Musculoskeletal: Negative for back pain.  Neurological: Positive for syncope.  Psychiatric/Behavioral: Negative for confusion.    Physical Exam Updated Vital Signs BP 108/74   Pulse (!) 54   Temp 97.9 F (36.6 C) (Oral)   Resp 16   SpO2 100%   Physical Exam Vitals and nursing note reviewed.  HENT:     Head: Atraumatic.     Mouth/Throat:     Mouth: Mucous membranes are moist.  Eyes:     General: No scleral icterus.    Pupils: Pupils are equal, round, and reactive to light.  Cardiovascular:     Rate and Rhythm: Normal rate.     Heart sounds: No murmur heard.   Pulmonary:     Effort: Pulmonary effort is normal.  Abdominal:     Tenderness: There is no abdominal tenderness.  Musculoskeletal:        General: No tenderness.  Cervical back: Neck supple.  Skin:    General: Skin is warm.     Capillary Refill: Capillary refill takes less than 2 seconds.  Neurological:     Mental Status: He is alert and oriented to person, place, and time.     ED Results / Procedures / Treatments   Labs (all labs ordered are listed, but only abnormal results are displayed) Labs Reviewed  BASIC METABOLIC PANEL - Abnormal; Notable for the following components:      Result Value   Glucose, Bld 116 (*)    All other components within normal limits  CBC WITH DIFFERENTIAL/PLATELET  RAPID URINE DRUG SCREEN, HOSP PERFORMED  TROPONIN I (HIGH SENSITIVITY)  TROPONIN I (HIGH SENSITIVITY)    EKG EKG Interpretation  Date/Time:  Friday April 29 2020 20:19:56 EST Ventricular Rate:  74 PR Interval:     QRS Duration: 101 QT Interval:  386 QTC Calculation: 429 R Axis:   101 Text Interpretation: Sinus rhythm Left posterior fascicular block ST elev, probable normal early repol pattern 12 Lead; Mason-Likar No significant change since last tracing Confirmed by Benjiman Core [phone]) on 04/29/2020 9:34:26 PM   Radiology CT Head Wo Contrast  Result Date: 04/29/2020 CLINICAL DATA:  Larey Seat from standing, syncope EXAM: CT HEAD WITHOUT CONTRAST TECHNIQUE: Contiguous axial images were obtained from the base of the skull through the vertex without intravenous contrast. COMPARISON:  None. FINDINGS: Brain: No acute infarct or hemorrhage. Lateral ventricles and midline structures are unremarkable. No acute extra-axial fluid collections. No mass effect. Vascular: No hyperdense vessel or unexpected calcification. Skull: Normal. Negative for fracture or focal lesion. Sinuses/Orbits: No acute finding. Other: None. IMPRESSION: 1. No acute intracranial process. Electronically Signed   By: Sharlet Salina M.D.   On: 04/29/2020 22:15   DG Chest Portable 1 View  Result Date: 04/29/2020 CLINICAL DATA:  Syncope, hit head EXAM: PORTABLE CHEST 1 VIEW COMPARISON:  11/24/2019 FINDINGS: The heart size and mediastinal contours are within normal limits. Both lungs are clear. The visualized skeletal structures are unremarkable. IMPRESSION: No active disease. Electronically Signed   By: Sharlet Salina M.D.   On: 04/29/2020 21:54    Procedures Procedures   Medications Ordered in ED Medications - No data to display  ED Course  I have reviewed the triage vital signs and the nursing notes.  Pertinent labs & imaging results that were available during my care of the patient were reviewed by me and considered in my medical decision making (see chart for details).    MDM Rules/Calculators/A&P                          Patient with syncopal episode. Happened at store. Did hit head. Head CT reassuring. EKG reassuring. Blood work  reassuring. No murmur. No family history of early cardiac disease. I think patient stable for outpatient follow-up. Doubt severe cardiac arrhythmia as the cause. Doubt pulmonary was some. No apparent severe injury from the fall. Will discharge home. Had initially ordered urine drug screen. However patient admitted marijuana use and did not await for the drug screen. It would not really change much at this time but did show other abnormalities since he is back at his baseline. Final Clinical Impression(s) / ED Diagnoses Final diagnoses:  Syncope, unspecified syncope type    Rx / DC Orders ED Discharge Orders    None       Benjiman Core, MD 04/29/20 2342

## 2020-04-29 NOTE — ED Triage Notes (Signed)
Pt reports a syncopal episode while at the store around [employer]. Falling from standing position hitting back of head on the ground.

## 2022-01-20 IMAGING — DX DG CHEST 1V PORT
1 series · 1 of 1 positions shown · non-contrast
Comparison: 11/24/2019

CLINICAL DATA: Syncope, hit head

EXAM:
PORTABLE CHEST 1 VIEW

[chest ap]
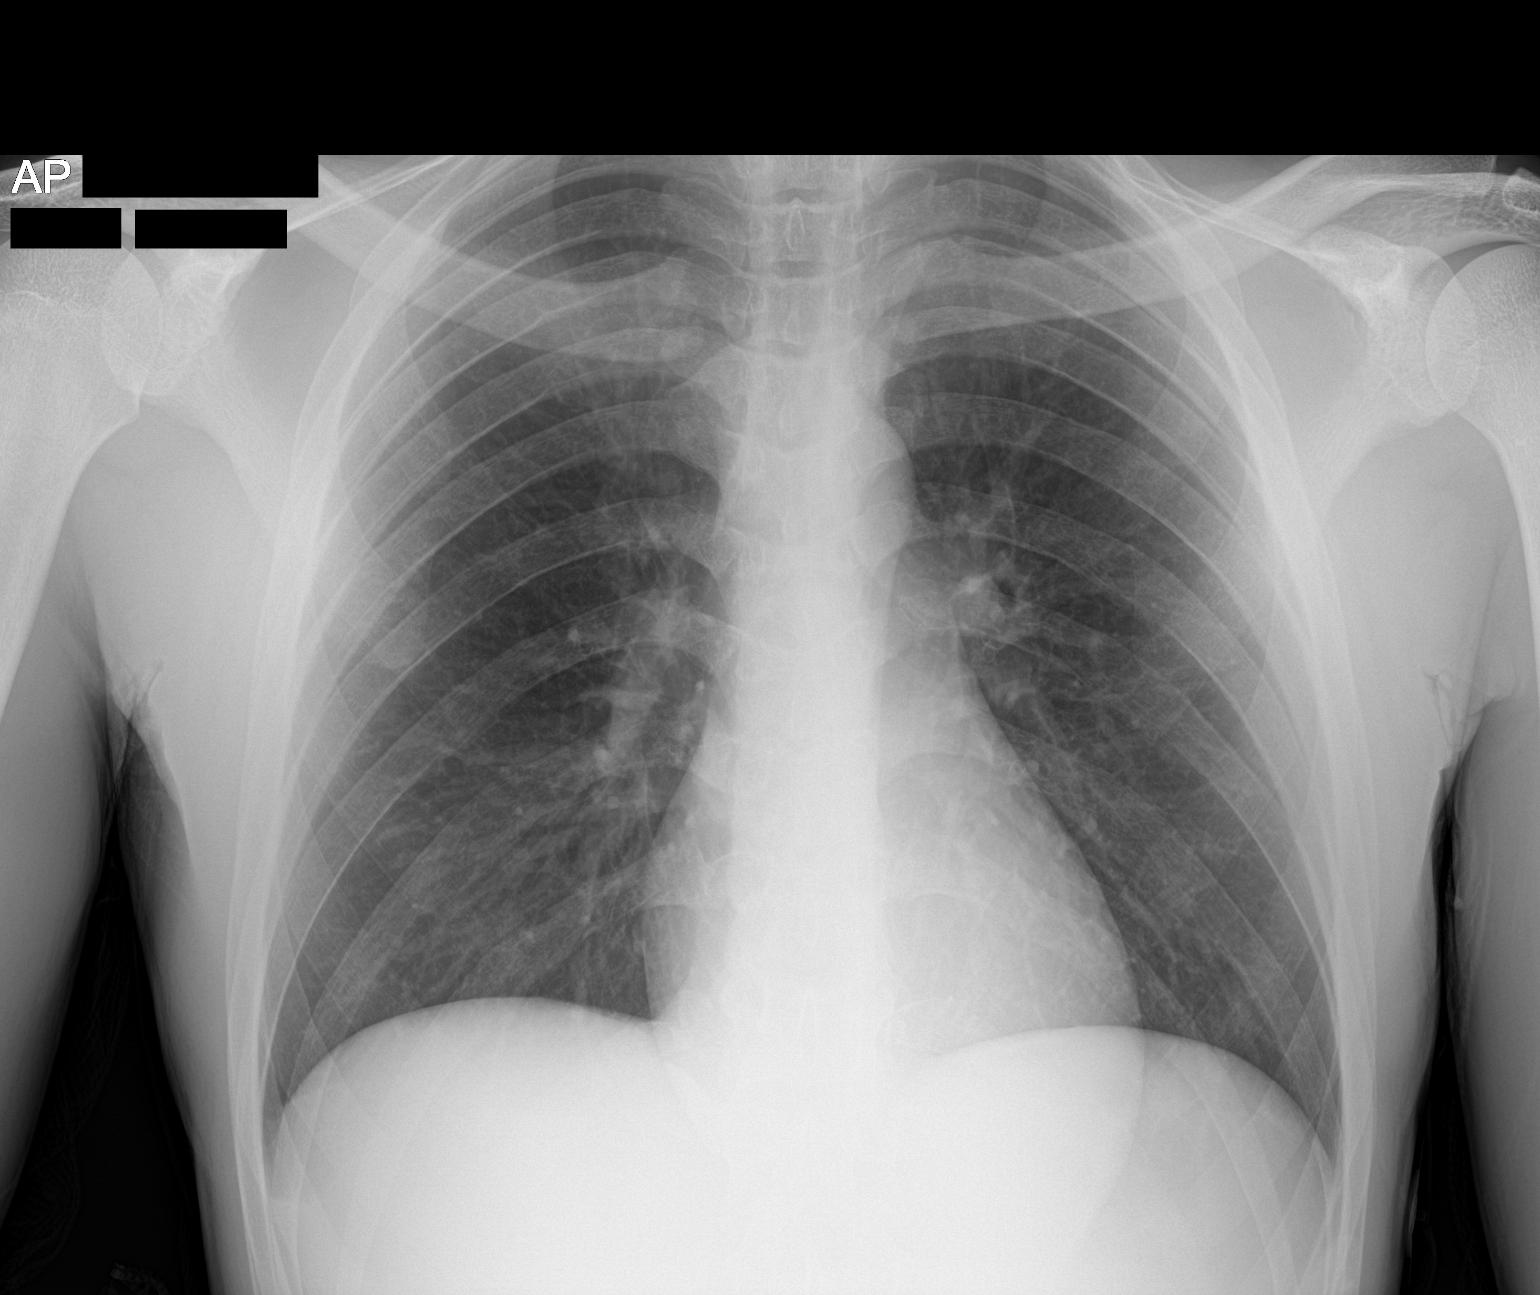

[1 of 1 positions shown; findings below may reference images not displayed]

FINDINGS: The heart size and mediastinal contours are within normal limits.
Both lungs are clear. The visualized skeletal structures are
unremarkable.
IMPRESSION: No active disease.
